# Patient Record
Sex: Female | Born: 1960 | Race: White | Hispanic: No | Marital: Married | Smoking: Never smoker
Health system: Southern US, Community
[De-identification: ages and names within clinical notes are randomized; demographics above are authoritative.]

## PROBLEM LIST (undated history)

## (undated) DIAGNOSIS — E119 Type 2 diabetes mellitus without complications: Secondary | ICD-10-CM

## (undated) DIAGNOSIS — F3189 Other bipolar disorder: Secondary | ICD-10-CM

## (undated) DIAGNOSIS — F419 Anxiety disorder, unspecified: Secondary | ICD-10-CM

## (undated) DIAGNOSIS — Z9289 Personal history of other medical treatment: Secondary | ICD-10-CM

## (undated) DIAGNOSIS — G4730 Sleep apnea, unspecified: Secondary | ICD-10-CM

## (undated) DIAGNOSIS — S31109A Unspecified open wound of abdominal wall, unspecified quadrant without penetration into peritoneal cavity, initial encounter: Secondary | ICD-10-CM

## (undated) DIAGNOSIS — I10 Essential (primary) hypertension: Secondary | ICD-10-CM

## (undated) DIAGNOSIS — Z8631 Personal history of diabetic foot ulcer: Secondary | ICD-10-CM

## (undated) DIAGNOSIS — K289 Gastrojejunal ulcer, unspecified as acute or chronic, without hemorrhage or perforation: Secondary | ICD-10-CM

## (undated) DIAGNOSIS — E785 Hyperlipidemia, unspecified: Secondary | ICD-10-CM

## (undated) DIAGNOSIS — F32A Depression, unspecified: Secondary | ICD-10-CM

## (undated) DIAGNOSIS — K819 Cholecystitis, unspecified: Secondary | ICD-10-CM

## (undated) DIAGNOSIS — T8579XA Infection and inflammatory reaction due to other internal prosthetic devices, implants and grafts, initial encounter: Secondary | ICD-10-CM

## (undated) DIAGNOSIS — R109 Unspecified abdominal pain: Secondary | ICD-10-CM

## (undated) DIAGNOSIS — G319 Degenerative disease of nervous system, unspecified: Secondary | ICD-10-CM

## (undated) HISTORY — DX: Personal history of other medical treatment: Z92.89

## (undated) HISTORY — DX: Unspecified abdominal pain: R10.9

## (undated) HISTORY — DX: Type 2 diabetes mellitus without complications: E11.9

## (undated) HISTORY — PX: BREAST BIOPSY: SHX20

## (undated) HISTORY — DX: Depression, unspecified: F32.A

## (undated) HISTORY — DX: Personal history of diabetic foot ulcer: Z86.31

## (undated) HISTORY — DX: Degenerative disease of nervous system, unspecified: G31.9

## (undated) HISTORY — DX: Gastrojejunal ulcer, unspecified as acute or chronic, without hemorrhage or perforation: K28.9

## (undated) HISTORY — DX: Essential (primary) hypertension: I10

## (undated) HISTORY — DX: Unspecified open wound of abdominal wall, unspecified quadrant without penetration into peritoneal cavity, initial encounter: S31.109A

## (undated) HISTORY — PX: SMALL INTESTINE SURGERY: SHX150

## (undated) HISTORY — PX: ABDOMINAL HYSTERECTOMY: SHX81

## (undated) HISTORY — DX: Sleep apnea, unspecified: G47.30

## (undated) HISTORY — DX: Cholecystitis, unspecified: K81.9

## (undated) HISTORY — DX: Hyperlipidemia, unspecified: E78.5

## (undated) HISTORY — PX: OTHER SURGICAL HISTORY: SHX169

## (undated) HISTORY — DX: Other bipolar disorder: F31.89

## (undated) HISTORY — DX: Anxiety disorder, unspecified: F41.9

---

## 1994-07-03 HISTORY — PX: OTHER SURGICAL HISTORY: SHX169

## 2019-07-04 DIAGNOSIS — Z8631 Personal history of diabetic foot ulcer: Secondary | ICD-10-CM | POA: Insufficient documentation

## 2020-04-01 DIAGNOSIS — E1165 Type 2 diabetes mellitus with hyperglycemia: Secondary | ICD-10-CM | POA: Insufficient documentation

## 2020-04-01 DIAGNOSIS — Z7689 Persons encountering health services in other specified circumstances: Secondary | ICD-10-CM | POA: Insufficient documentation

## 2020-04-01 DIAGNOSIS — R202 Paresthesia of skin: Secondary | ICD-10-CM | POA: Insufficient documentation

## 2020-04-01 DIAGNOSIS — IMO0002 Reserved for concepts with insufficient information to code with codable children: Secondary | ICD-10-CM | POA: Insufficient documentation

## 2020-04-01 DIAGNOSIS — F3189 Other bipolar disorder: Secondary | ICD-10-CM | POA: Insufficient documentation

## 2020-04-01 DIAGNOSIS — S31109A Unspecified open wound of abdominal wall, unspecified quadrant without penetration into peritoneal cavity, initial encounter: Secondary | ICD-10-CM | POA: Insufficient documentation

## 2020-04-01 DIAGNOSIS — I10 Essential (primary) hypertension: Secondary | ICD-10-CM | POA: Insufficient documentation

## 2020-04-23 ENCOUNTER — Other Ambulatory Visit: Payer: Self-pay | Admitting: General Surgery

## 2020-04-23 DIAGNOSIS — L02211 Cutaneous abscess of abdominal wall: Secondary | ICD-10-CM

## 2020-04-23 DIAGNOSIS — S31109A Unspecified open wound of abdominal wall, unspecified quadrant without penetration into peritoneal cavity, initial encounter: Secondary | ICD-10-CM

## 2020-05-05 ENCOUNTER — Ambulatory Visit
Admission: RE | Admit: 2020-05-05 | Discharge: 2020-05-05 | Disposition: A | Discharge status: Home / Self Care | Payer: Medicare Other | Source: Ambulatory Visit | Attending: General Surgery | Admitting: General Surgery

## 2020-05-05 ENCOUNTER — Other Ambulatory Visit: Payer: Self-pay

## 2020-05-05 DIAGNOSIS — L02211 Cutaneous abscess of abdominal wall: Secondary | ICD-10-CM

## 2020-05-05 DIAGNOSIS — S31109A Unspecified open wound of abdominal wall, unspecified quadrant without penetration into peritoneal cavity, initial encounter: Secondary | ICD-10-CM | POA: Insufficient documentation

## 2020-05-05 MED ORDER — IOHEXOL 300 MG/ML  SOLN
100.0000 mL | Freq: Once | INTRAMUSCULAR | Status: AC | PRN
  Administered 2020-05-05: 100 mL via INTRAVENOUS

## 2020-05-18 DIAGNOSIS — T8189XA Other complications of procedures, not elsewhere classified, initial encounter: Secondary | ICD-10-CM | POA: Insufficient documentation

## 2020-06-09 DIAGNOSIS — R5381 Other malaise: Secondary | ICD-10-CM | POA: Insufficient documentation

## 2020-09-07 ENCOUNTER — Inpatient Hospital Stay: Payer: Medicare Other

## 2020-09-07 ENCOUNTER — Inpatient Hospital Stay: Payer: Medicare Other | Attending: Oncology | Admitting: Oncology

## 2020-09-07 ENCOUNTER — Encounter: Payer: Self-pay | Admitting: Oncology

## 2020-09-07 ENCOUNTER — Encounter (INDEPENDENT_AMBULATORY_CARE_PROVIDER_SITE_OTHER): Payer: Self-pay

## 2020-09-07 VITALS — BP 117/81 | HR 60 | Temp 97.7°F | Resp 18 | Ht 66.0 in | Wt 284.3 lb

## 2020-09-07 DIAGNOSIS — Z1501 Genetic susceptibility to malignant neoplasm of breast: Secondary | ICD-10-CM | POA: Diagnosis not present

## 2020-09-07 DIAGNOSIS — Z809 Family history of malignant neoplasm, unspecified: Secondary | ICD-10-CM

## 2020-09-07 DIAGNOSIS — Z800 Family history of malignant neoplasm of digestive organs: Secondary | ICD-10-CM | POA: Insufficient documentation

## 2020-09-07 DIAGNOSIS — E119 Type 2 diabetes mellitus without complications: Secondary | ICD-10-CM | POA: Insufficient documentation

## 2020-09-07 DIAGNOSIS — Z833 Family history of diabetes mellitus: Secondary | ICD-10-CM | POA: Insufficient documentation

## 2020-09-07 DIAGNOSIS — Z8042 Family history of malignant neoplasm of prostate: Secondary | ICD-10-CM | POA: Insufficient documentation

## 2020-09-07 DIAGNOSIS — Z7984 Long term (current) use of oral hypoglycemic drugs: Secondary | ICD-10-CM | POA: Insufficient documentation

## 2020-09-07 DIAGNOSIS — Z801 Family history of malignant neoplasm of trachea, bronchus and lung: Secondary | ICD-10-CM | POA: Diagnosis not present

## 2020-09-07 DIAGNOSIS — Z8249 Family history of ischemic heart disease and other diseases of the circulatory system: Secondary | ICD-10-CM | POA: Insufficient documentation

## 2020-09-07 DIAGNOSIS — Z803 Family history of malignant neoplasm of breast: Secondary | ICD-10-CM | POA: Diagnosis not present

## 2020-09-07 DIAGNOSIS — I10 Essential (primary) hypertension: Secondary | ICD-10-CM | POA: Diagnosis not present

## 2020-09-07 DIAGNOSIS — Z8349 Family history of other endocrine, nutritional and metabolic diseases: Secondary | ICD-10-CM | POA: Diagnosis not present

## 2020-09-07 DIAGNOSIS — Z1509 Genetic susceptibility to other malignant neoplasm: Secondary | ICD-10-CM | POA: Diagnosis not present

## 2020-09-07 DIAGNOSIS — Z807 Family history of other malignant neoplasms of lymphoid, hematopoietic and related tissues: Secondary | ICD-10-CM | POA: Diagnosis not present

## 2020-09-07 DIAGNOSIS — Z90710 Acquired absence of both cervix and uterus: Secondary | ICD-10-CM | POA: Diagnosis not present

## 2020-09-07 DIAGNOSIS — Z79899 Other long term (current) drug therapy: Secondary | ICD-10-CM | POA: Diagnosis not present

## 2020-09-07 DIAGNOSIS — T8189XS Other complications of procedures, not elsewhere classified, sequela: Secondary | ICD-10-CM

## 2020-09-07 NOTE — Progress Notes (Signed)
Patient here to establish care  

## 2020-09-07 NOTE — Progress Notes (Addendum)
Hematology/Oncology Consult note [facility] Telephone:([phone] [phone]   Patient Care Team: Gladstone Lighter, MD as PCP - General (Internal Medicine)  REFERRING PROVIDER: Gladstone Lighter, MD  CHIEF COMPLAINTS/REASON FOR VISIT:  Evaluation of genetic susceptibility to cancer  HISTORY OF PRESENTING ILLNESS:   Sherry Joyce is a  60 y.o.  female with PMH listed below was seen in consultation at the request of  Gladstone Lighter, MD  for evaluation of  genetic susceptibility to cancer.  Patient reports significant family history of cancer, one sister with breast cancer, another sister with skin cancer, paternal grandfather with history of colon cancer, maternal uncle with colon cancer, another maternal uncle with lung cancer and prostate cancer and skin cancer, mother with Hodgkin's lymphoma.  Covid infection in January 2022. She has diabetes. She has a wound in her left foot which was debrided by podiatry.  Patient reports moving from [place] a few months ago. She was tested positive for BRCA2 mutation there. Underwent Patient had TAH BSO done had an nonhealing abdominal wound.  Patient reports that she has been seen by Dr. Peyton Najjar. There was plan for bilateral mastectomy prophylactically however plan was delayed due to Covid and nonhealing abdominal wound.  She follows up with wound care She reports that her last mammogram was done in about a year ago.    Review of Systems  Constitutional: Negative for appetite change, chills, fatigue and fever.  HENT:   Negative for hearing loss and voice change.   Eyes: Negative for eye problems.  Respiratory: Negative for chest tightness and cough.   Cardiovascular: Negative for chest pain.  Gastrointestinal: Negative for abdominal distention, abdominal pain and blood in stool.  Endocrine: Negative for hot flashes.  Genitourinary: Negative for difficulty urinating and frequency.   Musculoskeletal:  Negative for arthralgias.  Skin: Negative for itching and rash.       Nonhealing abdominal surgical wound  Neurological: Negative for extremity weakness.  Hematological: Negative for adenopathy.  Psychiatric/Behavioral: Negative for confusion.    MEDICAL HISTORY:  Past Medical History:  Diagnosis Date  . Abdominal pain of unknown etiology   . Anxiety   . 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 Cholecystitis   . Depression   . Diabetes mellitus without complication ([place])   . Gastrointestinal ulcer   . History of blood transfusion   . Hyperlipidemia   . Hypertension   . 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[place])   . Open abdominal wall wound   . Personal history of diabetic foot ulcer   . Sleep apnea     SURGICAL HISTORY: Past Surgical History:  Procedure Laterality Date  . ABDOMINAL HYSTERECTOMY    . cholecstectomy  1996  . hernia repair  1996   found during gallbladder surgery  . PR cesasean delivery     1984, [place]  . PR exploratory of abdomen  1996  . SMALL INTESTINE SURGERY      SOCIAL HISTORY: Social History   Socioeconomic History  . Marital status: Married    Spouse name: Not on file  . Number of children: Not on file  . Years of education: Not on file  . Highest education level: Not on file  Occupational History  . Not on file  Tobacco Use  . Smoking status: Never Smoker  . Smokeless tobacco: Never Used  Vaping Use  . Vaping Use: Never used  Substance and Sexual Activity  . Alcohol use: Never  . Drug use: Never  . Sexual  activity: Not on file  Other Topics Concern  . Not on file  Social History Narrative  . Not on file   Social Determinants of Health   Financial Resource Strain: Not on file  Food Insecurity: Not on file  Transportation Needs: Not on file  Physical Activity: Not on file  Stress: Not on file  Social Connections: Not on file  Intimate Partner Violence: Not on file    FAMILY HISTORY: Family History  Problem Relation  Age of Onset  . Hodgkin's lymphoma Mother   . Skin cancer Father   . Alzheimer's disease Father   . Asthma Father   . COPD Father   . Colon polyps Father   . Diabetes Father   . Hypertension Father   . Cancer Father        skin cancer  . Hyperlipidemia Father   . Obesity Father   . Sleep apnea Father   . Breast cancer Sister   . Alcohol abuse Maternal Uncle   . Hypertension Brother   . Sleep apnea Brother   . Diabetes Maternal Aunt   . Hypertension Maternal Grandmother   . Hyperlipidemia Maternal Grandmother   . Sudden Cardiac Death Maternal Grandmother   . Stroke Maternal Grandfather   . Sudden Cardiac Death Maternal Grandfather   . Alzheimer's disease Paternal Grandmother   . Colon cancer Paternal Grandfather   . Obesity Daughter   . Depression Daughter   . Depression Daughter   . Obesity Daughter   . Sleep apnea Daughter   . Diabetes Maternal Aunt   . Colon cancer Maternal Uncle   . Sudden Cardiac Death Maternal Uncle   . Obesity Maternal Uncle   . Alcohol abuse Maternal Uncle   . Lung cancer Maternal Uncle   . Prostate cancer Maternal Uncle   . Skin cancer Maternal Uncle   . Depression Sister   . Skin cancer Sister   . Alcohol abuse Son   . Depression Son   . Hypertension Son   . Obesity Son   . Sleep apnea Son     ALLERGIES:  has no allergies on file.  MEDICATIONS:  Current Outpatient Medications  Medication Sig Dispense Refill  . amLODipine (NORVASC) 10 MG tablet Take 10 mg by mouth daily.    . Cyanocobalamin (VITAMIN B-12 IJ) Inject as directed every 28 (twenty-eight) days.    Marland Kitchen FARXIGA 10 MG TABS tablet Take 10 mg by mouth daily.    . fluconazole (DIFLUCAN) 100 MG tablet Take 100 mg by mouth daily.    Marland Kitchen glimepiride (AMARYL) 4 MG tablet Take 4 mg by mouth daily.    Marland Kitchen ibuprofen (ADVIL) 800 MG tablet Take 800 mg by mouth 2 (two) times daily as needed.    . lamoTRIgine (LAMICTAL) 200 MG tablet Take 200 mg by mouth 2 (two) times daily.    Marland Kitchen losartan  (COZAAR) 50 MG tablet Take 50 mg by mouth daily.    . metFORMIN (GLUCOPHAGE-XR) 500 MG 24 hr tablet Take 1 tablet by mouth 2 (two) times daily with a meal.    . Oxcarbazepine (TRILEPTAL) 300 MG tablet Take 300 mg by mouth 2 (two) times daily.    . pravastatin (PRAVACHOL) 20 MG tablet Take 20 mg by mouth at bedtime.    . RYBELSUS 7 MG TABS Take 1 tablet by mouth daily.    . sertraline (ZOLOFT) 100 MG tablet Take 100 mg by mouth daily.     No current facility-administered medications for this  visit.     PHYSICAL EXAMINATION: ECOG PERFORMANCE STATUS: 1 - Symptomatic but completely ambulatory Vitals:   09/07/20 1147  BP: 117/81  Pulse: 60  Resp: 18  Temp: 97.7 F (36.5 C)   Filed Weights   09/07/20 1147  Weight: 284 lb 4.8 oz (129 kg)    Physical Exam Constitutional:      General: She is not in acute distress.    Appearance: She is obese.  HENT:     Head: Normocephalic and atraumatic.  Eyes:     General: No scleral icterus. Cardiovascular:     Rate and Rhythm: Normal rate and regular rhythm.     Heart sounds: Normal heart sounds.  Pulmonary:     Effort: Pulmonary effort is normal. No respiratory distress.     Breath sounds: No wheezing.  Abdominal:     General: Bowel sounds are normal. There is no distension.     Palpations: Abdomen is soft.  Musculoskeletal:        General: No deformity. Normal range of motion.     Cervical back: Normal range of motion and neck supple.  Skin:    General: Skin is warm.  Neurological:     Mental Status: She is alert and oriented to person, place, and time. Mental status is at baseline.     Cranial Nerves: No cranial nerve deficit.     Coordination: Coordination normal.  Psychiatric:        Mood and Affect: Mood normal.     LABORATORY DATA:  I have reviewed the data as listed No results found for: WBC, HGB, HCT, MCV, PLT No results for input(s): NA, K, CL, CO2, GLUCOSE, BUN, CREATININE, CALCIUM, GFRNONAA, GFRAA, PROT, ALBUMIN,  AST, ALT, ALKPHOS, BILITOT, BILIDIR, IBILI in the last 8760 hours. Iron/TIBC/Ferritin/ %Sat No results found for: IRON, TIBC, FERRITIN, IRONPCTSAT    RADIOGRAPHIC STUDIES: I have personally reviewed the radiological images as listed and agreed with the findings in the report. No results found.    ASSESSMENT & PLAN:  1. Family history of cancer   2. Non-healing surgical wound, sequela    #Family history of cancer, report to be BRCA2 positive. Will have patient sign release form and try to obtain medical records from [place]. She has considered prophylactic bilateral mastectomy however surgery is delayed due to Covid as well as surgical wound issue. I recommend patient to proceed with MRI breast bilateral annually alternating with diagnostic mammogram annually with images staggered by 6 months. Further recommendation pending her medical records and breast image results.   All questions were answered. The patient knows to call the clinic with any problems questions or concerns.  cc Gladstone Lighter, MD    Return of visit: Follow-up to be determined  Earlie Server, MD, PhD Hematology [place] at [facility] Pager- [phone] 09/07/2020  # Addendum:  11/11/2020 received copy of her genetic testing- scanned to EMR 09/12/2019 [facility] BRCA1 and 2 analysis showed positive for BRCA2 heterozygous mutation, [id number].   Earlie Server

## 2020-09-14 ENCOUNTER — Telehealth: Payer: Self-pay | Admitting: *Deleted

## 2020-09-14 NOTE — Telephone Encounter (Signed)
Genetic testing results have been requested but not received yet.

## 2020-09-14 NOTE — Telephone Encounter (Signed)
Call returned to patient , she said that she will call [place] and ask that they please fax results as requested

## 2020-09-14 NOTE — Telephone Encounter (Signed)
Does she want the sisters results?

## 2020-09-14 NOTE — Telephone Encounter (Signed)
If we are going to get her BRCA result then I don't need a copy of her sister's result. If we are not able to get the results, I will ask her to see genetic counselor who may want to have those results. Please ask her to keep results at home and if needed, we will ask her to bring in.

## 2020-09-14 NOTE — Telephone Encounter (Signed)
Patient called asking if Dr Tasia Catchings has received the Genetic Testing form her doctor in [place] yet. She reports that she has the genetic testing of her 2 sisters on her phone and is asking if Dr Tasia Catchings would want that information. Please Sherry Joyce

## 2020-09-24 NOTE — Telephone Encounter (Signed)
Received surg path records and last OV note from [facility]. Per fax cover sheet, they did  not have any genetic testing. Contacted pt and notified her and she states that she was aware and was working to get a copy of the BRCA testing as well. She has contacted [facility] and they told her that the ordering Dr would have to request it to get it faster. She has contacted that doctor and they are working to obtain results. They will fax a copy to [place] once obtained.

## 2020-10-28 ENCOUNTER — Telehealth: Payer: Self-pay | Admitting: Oncology

## 2020-10-28 NOTE — Telephone Encounter (Signed)
Spoke to patient and states that she cannot find BRCA results, she has tried to request them and has not received anything. We requested them from [facility] back in March and were told that they did not have any results. Pt states that Dr. Yu wanted to see results before scheduling follow up. Since she in unable to find results she is asking what the follow up plan is? Please advise.   

## 2020-10-28 NOTE — Telephone Encounter (Signed)
Please refer her to genetic counselor

## 2020-10-29 NOTE — Telephone Encounter (Signed)
Called patient back and she states that after we talked yesterday, she received call from [place] clinic stating that they had her BRCA results and will be mailing to her. She will bring them to cancer center once she receives it. Is referral to genetic counselor still needed?

## 2020-11-02 NOTE — Telephone Encounter (Signed)
We will wait for BRCA results for MD to review prior to scheduling f/u appt. Genetic counselor appt no longer needed.

## 2020-11-09 ENCOUNTER — Telehealth: Payer: Self-pay | Admitting: Oncology

## 2020-11-09 NOTE — Telephone Encounter (Signed)
Patient left vm requesting appointment with Dr. Yu as her BRCA results are back.  Please advise.   

## 2020-11-10 NOTE — Telephone Encounter (Signed)
Can we ask her to drop a copy of her result? And I plan to order images based on the images. Discussed during her last visit.

## 2020-11-10 NOTE — Telephone Encounter (Signed)
Patient called the office on 11/09/20 requestting appointment with Dr. Tasia Catchings as her BRCA results are back.  Patient has results but has not provided a copy to our office yet.  OK to schedule appt and MD review results at visit?

## 2020-11-10 NOTE — Telephone Encounter (Signed)
Dr. Tasia Catchings, patient has results but no copy give to [place] yet.  OK to schedule MD appt?

## 2020-11-11 NOTE — Telephone Encounter (Signed)
Copy given to MD and copy sent to be scanned in chart.

## 2020-11-11 NOTE — Telephone Encounter (Signed)
Patient plans to bring a copy of results today

## 2020-11-11 NOTE — Telephone Encounter (Signed)
See 10/28/20 phone note

## 2020-11-12 ENCOUNTER — Other Ambulatory Visit: Payer: Self-pay

## 2020-11-12 DIAGNOSIS — Z809 Family history of malignant neoplasm, unspecified: Secondary | ICD-10-CM

## 2020-11-12 DIAGNOSIS — Z1501 Genetic susceptibility to malignant neoplasm of breast: Secondary | ICD-10-CM

## 2020-11-12 NOTE — Progress Notes (Signed)
Error

## 2020-11-12 NOTE — Telephone Encounter (Signed)
Dr. Tasia Catchings, please let us know schedule plan after reviewing results.

## 2020-11-12 NOTE — Telephone Encounter (Signed)
Pt is going to [place] to sign consent to get images from [place]. Once they get those they will call pt for an appt to be set up. Pt is aware of this and understands. Thanks

## 2020-11-12 NOTE — Telephone Encounter (Signed)
Per Staff message from Dr. Tasia Catchings:   Reviewed her genetic testing. She is positive for BRCA2  Please arrange her to get MRI breast bilateral next available. . Reason is BRCA2 carrier. Thanks.  She can follow up with me about 6 months after MRI. MD only.   Colette please schedule appts as requested and call pt with appts, she is aware of plan. Pt's last mammograms were done in [place] and she is aware that she may need to go to [place] to sign release of info, if needed.

## 2020-11-23 ENCOUNTER — Telehealth: Payer: Self-pay | Admitting: *Deleted

## 2020-11-23 NOTE — Telephone Encounter (Signed)
Per Phone note from [place] on 5/13: Pt is going to [place] to sign consent to get images from [place]. Once they get those they will call pt for an appt to be set up.  She will need call [facility] to follow up on MRI appt. We will call her with follow up appt with Dr. Tasia Catchings once MRI is scheduled.

## 2020-11-23 NOTE — Telephone Encounter (Signed)
Colette, could you call [facility] for an update?

## 2020-11-23 NOTE — Telephone Encounter (Signed)
Patient called and states that she was told that she would be getting a call for MRI, but hse has not heard anything form anyone about appointment yet. I do not see that an appointment has been scheduled

## 2020-11-24 NOTE — Telephone Encounter (Signed)
Called pt has not been by to sign consent, called her and gave her directions again and she apologized again for not doing this sooner. She will take care of it this week.

## 2020-12-14 ENCOUNTER — Other Ambulatory Visit: Payer: Self-pay | Admitting: *Deleted

## 2020-12-14 ENCOUNTER — Inpatient Hospital Stay
Admission: RE | Admit: 2020-12-14 | Discharge: 2020-12-14 | Disposition: A | Discharge status: Home / Self Care | Payer: Self-pay | Source: Ambulatory Visit | Attending: *Deleted | Admitting: *Deleted

## 2020-12-14 DIAGNOSIS — Z9289 Personal history of other medical treatment: Secondary | ICD-10-CM

## 2020-12-16 NOTE — Telephone Encounter (Signed)
Per Aldona Bar @ [facility] :  Her last mammogram report we received was on 08/11/19, over a year ago.   So she is overdue for her yearly mammogram, which needs to be done prior to scheduling a breast MRI. The radiologist recommendation for a breast MRI for a high risk pt is for the breast MRI to be done 6 months after the mammogram.   Called patient and she confirmed that her last mammo was in Feb 2021. She will call [facility] to set up mammogram and MRI appt.

## 2020-12-22 ENCOUNTER — Other Ambulatory Visit: Payer: Self-pay | Admitting: Internal Medicine

## 2020-12-22 DIAGNOSIS — Z1231 Encounter for screening mammogram for malignant neoplasm of breast: Secondary | ICD-10-CM

## 2020-12-27 ENCOUNTER — Ambulatory Visit
Admission: RE | Admit: 2020-12-27 | Discharge: 2020-12-27 | Disposition: A | Discharge status: Home / Self Care | Payer: Medicare Other | Source: Ambulatory Visit | Attending: Internal Medicine | Admitting: Internal Medicine

## 2020-12-27 ENCOUNTER — Other Ambulatory Visit: Payer: Self-pay

## 2020-12-27 DIAGNOSIS — Z1231 Encounter for screening mammogram for malignant neoplasm of breast: Secondary | ICD-10-CM | POA: Insufficient documentation

## 2021-01-05 ENCOUNTER — Telehealth: Payer: Self-pay

## 2021-01-05 NOTE — Telephone Encounter (Signed)
-----   Message from Earlie Server, MD sent at 01/03/2021  1:09 PM EDT ----- Madaline Brilliant, please schedule her to do MRI 6 months after mammogram and I will see her a few days after MRI. Thanks.  ----- Message ----- From: Evelina Dun, RN Sent: 12/30/2020  10:09 AM EDT To: Evelina Dun, RN, Earlie Server, MD  Patient was unable to get MRI breast because she was overdue for mammogram. Mammogram was done on 6/28. When I spoke to [place] at [facility] a couple weeks ago she stated that MRI is usually done 6 months after Mammo. At last visit on 3/8 you said you wanted her to follow up with you 6 weeks after MRI. Any change of plans with follow up? are we ok to schedule MRI now that she has done mammo.

## 2021-01-05 NOTE — Telephone Encounter (Signed)
Pt had Mammo on 6/27, can you please schedule MRI & MD follow up as requested and notify pt of appt please. Thanks.

## 2021-01-06 NOTE — Telephone Encounter (Signed)
Can you ladies please schedule and notify pt of appt. 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 Thanks

## 2021-05-30 ENCOUNTER — Ambulatory Visit: Payer: Medicare Other

## 2021-06-01 ENCOUNTER — Inpatient Hospital Stay: Payer: Medicare Other | Admitting: Oncology

## 2021-06-14 ENCOUNTER — Ambulatory Visit
Admission: RE | Admit: 2021-06-14 | Discharge: 2021-06-14 | Disposition: A | Discharge status: Home / Self Care | Payer: Medicare Other | Source: Ambulatory Visit | Attending: Oncology | Admitting: Oncology

## 2021-06-14 ENCOUNTER — Other Ambulatory Visit: Payer: Self-pay | Admitting: Internal Medicine

## 2021-06-14 DIAGNOSIS — Z1509 Genetic susceptibility to other malignant neoplasm: Secondary | ICD-10-CM | POA: Diagnosis present

## 2021-06-14 DIAGNOSIS — Z1501 Genetic susceptibility to malignant neoplasm of breast: Secondary | ICD-10-CM | POA: Diagnosis present

## 2021-06-14 DIAGNOSIS — N6322 Unspecified lump in the left breast, upper inner quadrant: Secondary | ICD-10-CM | POA: Diagnosis not present

## 2021-06-14 DIAGNOSIS — L02216 Cutaneous abscess of umbilicus: Secondary | ICD-10-CM

## 2021-06-14 DIAGNOSIS — L089 Local infection of the skin and subcutaneous tissue, unspecified: Secondary | ICD-10-CM

## 2021-06-14 DIAGNOSIS — Z809 Family history of malignant neoplasm, unspecified: Secondary | ICD-10-CM | POA: Insufficient documentation

## 2021-06-14 MED ORDER — GADOBUTROL 1 MMOL/ML IV SOLN
10.0000 mL | Freq: Once | INTRAVENOUS | Status: AC | PRN
  Administered 2021-06-14: 10 mL via INTRAVENOUS

## 2021-06-16 ENCOUNTER — Other Ambulatory Visit: Payer: Self-pay | Admitting: Oncology

## 2021-06-16 DIAGNOSIS — R928 Other abnormal and inconclusive findings on diagnostic imaging of breast: Secondary | ICD-10-CM

## 2021-06-16 DIAGNOSIS — N6320 Unspecified lump in the left breast, unspecified quadrant: Secondary | ICD-10-CM

## 2021-06-17 ENCOUNTER — Inpatient Hospital Stay: Payer: Medicare Other | Admitting: Oncology

## 2021-06-23 ENCOUNTER — Inpatient Hospital Stay: Payer: Medicare Other | Attending: Oncology | Admitting: Oncology

## 2021-06-23 ENCOUNTER — Encounter: Payer: Self-pay | Admitting: Oncology

## 2021-06-23 DIAGNOSIS — Z7984 Long term (current) use of oral hypoglycemic drugs: Secondary | ICD-10-CM | POA: Insufficient documentation

## 2021-06-23 DIAGNOSIS — E1165 Type 2 diabetes mellitus with hyperglycemia: Secondary | ICD-10-CM | POA: Diagnosis not present

## 2021-06-23 DIAGNOSIS — R930 Abnormal findings on diagnostic imaging of skull and head, not elsewhere classified: Secondary | ICD-10-CM | POA: Diagnosis not present

## 2021-06-23 DIAGNOSIS — Z1509 Genetic susceptibility to other malignant neoplasm: Secondary | ICD-10-CM | POA: Diagnosis not present

## 2021-06-23 DIAGNOSIS — R928 Other abnormal and inconclusive findings on diagnostic imaging of breast: Secondary | ICD-10-CM

## 2021-06-23 DIAGNOSIS — Z1501 Genetic susceptibility to malignant neoplasm of breast: Secondary | ICD-10-CM | POA: Diagnosis not present

## 2021-06-23 DIAGNOSIS — T8189XS Other complications of procedures, not elsewhere classified, sequela: Secondary | ICD-10-CM | POA: Diagnosis not present

## 2021-06-23 NOTE — Progress Notes (Signed)
Pt contacted for Mychart visit. Pt reports she is currently on Augmentin and prednisone for bronchitis.
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
## 2021-06-28 ENCOUNTER — Ambulatory Visit
Admission: RE | Admit: 2021-06-28 | Discharge: 2021-06-28 | Disposition: A | Discharge status: Home / Self Care | Payer: Medicare Other | Source: Ambulatory Visit | Attending: Oncology | Admitting: Oncology

## 2021-06-28 ENCOUNTER — Other Ambulatory Visit: Payer: Self-pay

## 2021-06-28 DIAGNOSIS — N6320 Unspecified lump in the left breast, unspecified quadrant: Secondary | ICD-10-CM | POA: Diagnosis present

## 2021-06-28 DIAGNOSIS — R928 Other abnormal and inconclusive findings on diagnostic imaging of breast: Secondary | ICD-10-CM | POA: Diagnosis present

## 2021-07-05 ENCOUNTER — Telehealth: Payer: Self-pay

## 2021-07-05 NOTE — Telephone Encounter (Signed)
-----   Message from Earlie Server, MD sent at 07/05/2021  8:21 AM EST ----- She needs MRI breast biopsy of the 2 left breast lesions.

## 2021-07-05 NOTE — Telephone Encounter (Signed)
Contacted Johnston Ebbs at [facility] regarding getting pt scheduled and what order to put in. Left message for her to call back and let us know how to proceed.

## 2021-07-06 ENCOUNTER — Other Ambulatory Visit: Payer: Self-pay

## 2021-07-06 DIAGNOSIS — Z1509 Genetic susceptibility to other malignant neoplasm: Secondary | ICD-10-CM

## 2021-07-06 DIAGNOSIS — R928 Other abnormal and inconclusive findings on diagnostic imaging of breast: Secondary | ICD-10-CM

## 2021-07-06 DIAGNOSIS — Z1501 Genetic susceptibility to malignant neoplasm of breast: Secondary | ICD-10-CM

## 2021-07-06 NOTE — Telephone Encounter (Signed)
Called [facility] and spoke to Dieterich who provided correct order for procedure. Orders entered. She will contact pt to schedule biopsy.

## 2021-07-08 ENCOUNTER — Other Ambulatory Visit: Payer: Self-pay | Admitting: Oncology

## 2021-07-08 DIAGNOSIS — R928 Other abnormal and inconclusive findings on diagnostic imaging of breast: Secondary | ICD-10-CM

## 2021-07-08 NOTE — Telephone Encounter (Addendum)
Called pt and she says that [facility] is out of network for her insurance and wants this done at a [facility], because she has financial assistance through them.  This type of biopsy is done through [facility]. I have reached out to GI and they are within pt's insurance network and they will reach out to pt to schedule this procedure.   Unable to reach pt, but left detailed VM to let her know about this information and that they will be reaching out to her to schedule biopsy.

## 2021-07-12 ENCOUNTER — Ambulatory Visit: Payer: Medicare Other

## 2021-07-12 NOTE — Telephone Encounter (Signed)
Spoke to patient and she would like this done at a [facility] facility since she she has financial assistance through them. I called [facility] scheduling and spoke to [facility] and she states we need to fax the order and then have pt call them to schedule procedure.   Called pt and told her about process and she verbalized understanding. She will call back to let us know procedure date/time. Orders have been faxed.   [facility] ph: [phone] fax: [phone]

## 2021-07-18 NOTE — Telephone Encounter (Addendum)
Received fax from [facility] asking for a call to schedule MR breast biopsy. Called to set up appt, but they are not working today in observance of holiday. Will call again tomorrow to set up appt  [facility] department:  [phone]

## 2021-07-19 NOTE — Telephone Encounter (Signed)
Left VM with Benjamine Mola @ [facility] for a call back to schedule patient's biopsy   Ph: [phone]

## 2021-07-20 NOTE — Telephone Encounter (Signed)
Spoke to [place], who states that they just received the images from [facility]. Once they were transferred to [facility] then she would submit care to MD for review. She would call us back with appt details.   Pt notified of this.

## 2021-07-22 ENCOUNTER — Telehealth: Payer: Self-pay | Admitting: Oncology

## 2021-07-22 ENCOUNTER — Telehealth: Payer: Self-pay | Admitting: *Deleted

## 2021-07-22 NOTE — Telephone Encounter (Signed)
Received call from [place] at [facility], stating that the Doctors have reviewed the case and are recommending a mammogram and anohter ultrasound before they can do the biospy. Called patietn to inform her of this. Provided her with [facility]'s number ot set these appts up [phone]. Asked her to call back with appt details once her biopsy is scheduled.

## 2021-07-22 NOTE — Telephone Encounter (Signed)
Pt called to let you know that she will be at [facility] on 1-24 @10am . Doing both ultrasound and mamo. Call back at [phone]

## 2021-07-22 NOTE — Telephone Encounter (Signed)
Patient called to inform us that her mammogram and [place] is schedule to be done at [facility] on 07/26/21 @ 10 AM

## 2021-07-25 NOTE — Telephone Encounter (Signed)
Pt scheduled for mammo and [place] on 1/24

## 2021-07-27 NOTE — Telephone Encounter (Signed)
Per [facility] mammo report, pt scheduled for MRI biopsy on 07/29/21 @ 11a and US biopsy @ 1:30p

## 2021-08-01 ENCOUNTER — Telehealth: Payer: Self-pay | Admitting: Oncology

## 2021-08-01 ENCOUNTER — Telehealth: Payer: Self-pay | Admitting: *Deleted

## 2021-08-01 NOTE — Telephone Encounter (Signed)
Pt called to make appt for her scan results. Call back at [phone]

## 2021-08-01 NOTE — Telephone Encounter (Signed)
Patient called stating that she had her biopsy at [facility] Friday and is asking when we will schedule a follow up appointment with Dr Tasia Catchings for her. Please advise

## 2021-08-01 NOTE — Telephone Encounter (Signed)
Per careEverywhere, path results are not ready yet, usually takes 7-10 days for it to be resulted.   Dr. Tasia Catchings when would you like for pt to follow up for results? She had biopsy on 1/27.

## 2021-08-02 ENCOUNTER — Telehealth: Payer: Self-pay | Admitting: Oncology

## 2021-08-02 NOTE — Telephone Encounter (Signed)
Contacted PCP's office and they will be faxing reports over.

## 2021-08-02 NOTE — Telephone Encounter (Signed)
I called pt to get her scheduled for her biopsy results. She stated that the results should have been faxed to her PCP Dr. Tressia Miners and was recommended an MRI in 6 months and mammo is 12 months. She stated she wanted to wait to schedule an appt until you read over there results and find it necessary for her to come in .

## 2021-08-02 NOTE — Telephone Encounter (Signed)
Biopsy report has not resulted yet. Please schedule patient for MD only early next week for results. Please inform pt of appt.

## 2021-08-03 NOTE — Telephone Encounter (Signed)
Dr. Tasia Catchings has reviewed biopsy report and results showed negative for cancer. She recommends MRI breast in 6 months and follow up with her a few days after scan. Spoke to patient and informed her of this and she voiced understanding. She would like the MRI breast done at [facility] facility due to insurance purposes. Informed her that if she needs scans done at [facility] due to insurance process then it would probably be best that she be referred to [facility] oncology/ high risk breast team to facilitate the process of ordering and getting results in a timely manner. Informed her that she will need to ask PCP to refer her to [facility] oncology / high risk breast team for further surveillance, pt verbalized understanding. Also informed her that if she decided that she wants to continue scans/ surveillance with Dr. Tasia Catchings, she can call back to set up app.

## 2021-08-04 ENCOUNTER — Encounter: Payer: Self-pay | Admitting: Internal Medicine

## 2022-02-12 ENCOUNTER — Encounter: Payer: Self-pay | Admitting: Gastroenterology

## 2022-02-12 NOTE — H&P (Signed)
Pre-Procedure H&P   Patient ID: Sherry Joyce is a 61 y.o. female.  Gastroenterology Provider: Annamaria Helling, DO  Referring Provider: Laurine Blazer, PA PCP: Gladstone Lighter, MD  Date: 02/13/2022  HPI Ms. Sherry Joyce is a 61 y.o. female who presents today for Esophagogastroduodenoscopy and Colonoscopy for Epigastric pain; surveillance- personal history of colon polyps.  Patient has noted epigastric fullness and discomfort especially postprandially.  She has noted increased bloating and diminished appetite which she attributes to her Trulicity. This is getting better. When seen in the office, she had noted some issues with swallowing, but this has resolved.  Has 1-2 bowel movements per day without melena or hematochezia.  She underwent colonoscopy in [place] in 2018 where a 3-year repeat was recommended.  She had a complicated oophorectomy and hysterectomy requiring ex lap Due to drainage. has also undergone a cholecystectomy.  Patient on chronic antibiotics.  Patient took her Trulicity on Friday and was instructed to have liquids over the last couple days.  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[place])    Cholecystitis    Depression    Diabetes mellitus without complication ([place])    Gastrointestinal ulcer    History of blood transfusion    Hyperlipidemia    Hypertension    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)    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[place])    Open abdominal wall wound    Personal history of diabetic foot ulcer    Sleep apnea     Past Surgical History:   Procedure Laterality Date   ABDOMINAL HYSTERECTOMY     BREAST BIOPSY Right    benign bx 2008  per pt   cholecstectomy  1996   hernia repair  1996   found during gallbladder surgery   PR cesasean delivery     1984, 1985, 1988   PR exploratory of abdomen  1996   SMALL INTESTINE SURGERY      Family History Maternal uncle and paternal grandfather with colorectal cancer, son with gastroparesis, father with colon polyps No h/o GI disease or malignancy  Review of Systems  Constitutional:  Positive for appetite change. Negative for activity change, chills, diaphoresis, fatigue, fever and unexpected weight change.  HENT:  Positive for trouble swallowing. Negative for voice change.   Respiratory:  Negative for shortness of breath and wheezing.   Cardiovascular:  Negative for chest pain, palpitations and leg swelling.  Gastrointestinal:  Positive for abdominal pain (epigastric). Negative for abdominal distention, anal bleeding, blood in stool, constipation, diarrhea, nausea, rectal pain and vomiting.       + bloating  Musculoskeletal:  Negative for arthralgias and myalgias.  Skin:  Negative for color change and pallor.  Neurological:  Negative for dizziness, syncope and weakness.  Psychiatric/Behavioral:  Negative for confusion.   All other systems reviewed and are negative.    Medications No current facility-administered medications on file prior to encounter.   Current Outpatient Medications on File Prior to Encounter  Medication Sig Dispense Refill   amoxicillin-clavulanate (AUGMENTIN) 875-125 MG tablet Take 1 tablet by mouth 2 (two) times daily.     doxycycline (ADOXA) 100  MG tablet Take 100 mg by mouth 2 (two) times daily.     Dulaglutide (TRULICITY) 1.5 TZ/0.0FV SOPN Inject 1.5 mg into the skin once a week.     Cyanocobalamin (VITAMIN B-12 IJ) Inject as directed every 28 (twenty-eight) days.     FARXIGA 10 MG TABS tablet Take 10 mg by mouth daily.     fluconazole (DIFLUCAN) 100  MG tablet Take 100 mg by mouth daily.     glimepiride (AMARYL) 4 MG tablet Take 4 mg by mouth daily.     ibuprofen (ADVIL) 800 MG tablet Take 800 mg by mouth 2 (two) times daily as needed.     losartan (COZAAR) 50 MG tablet Take 50 mg by mouth daily.     metFORMIN (GLUCOPHAGE-XR) 500 MG 24 hr tablet Take 1 tablet by mouth 2 (two) times daily with a meal.     pravastatin (PRAVACHOL) 20 MG tablet Take 20 mg by mouth at bedtime.     predniSONE (DELTASONE) 20 MG tablet Take 2 tablets by mouth daily for 5 days followed by 1 tablets by mouth daily for 5 days and stop      Pertinent medications related to GI and procedure were reviewed by me with the patient prior to the procedure   Current Facility-Administered Medications:    0.9 %  sodium chloride infusion, , Intravenous, Continuous, Annamaria Helling, DO, Last Rate: 20 mL/hr at 02/13/22 1010, New Bag at 02/13/22 1010      Not on File Allergies were reviewed by me prior to the procedure  Objective   Body mass index is 41.97 kg/m. Vitals:   02/13/22 0950  BP: 135/86  Pulse: 80  Resp: 20  Temp: (!) 96.9 F (36.1 C)  TempSrc: Temporal  SpO2: 98%  Weight: 117.9 kg  Height: '5\' 6"'$  (1.676 m)     Physical Exam Vitals and nursing note reviewed.  Constitutional:      General: She is not in acute distress.    Appearance: Normal appearance. She is obese. She is not ill-appearing, toxic-appearing or diaphoretic.  HENT:     Head: Normocephalic and atraumatic.     Nose: Nose normal.     Mouth/Throat:     Mouth: Mucous membranes are moist.     Pharynx: Oropharynx is clear.  Eyes:     General: No scleral icterus.    Extraocular Movements: Extraocular movements intact.  Cardiovascular:     Rate and Rhythm: Normal rate and regular rhythm.     Heart sounds: Normal heart sounds. No murmur heard.    No friction rub. No gallop.  Pulmonary:     Effort: Pulmonary effort is normal. No respiratory distress.     Breath sounds: Normal  breath sounds. No wheezing, rhonchi or rales.  Abdominal:     General: Bowel sounds are normal. There is no distension.     Palpations: Abdomen is soft.     Tenderness: There is no abdominal tenderness. There is no guarding or rebound.  Musculoskeletal:     Cervical back: Neck supple.     Right lower leg: No edema.     Left lower leg: No edema.  Skin:    General: Skin is warm and dry.     Coloration: Skin is not jaundiced or pale.  Neurological:     General: No focal deficit present.     Mental Status: She is alert and oriented to person, place, and time. Mental status is at baseline.  Psychiatric:  Mood and Affect: Mood normal.        Behavior: Behavior normal.        Thought Content: Thought content normal.        Judgment: Judgment normal.      Assessment:  Ms. Sherry Joyce is a 61 y.o. female  who presents today for Esophagogastroduodenoscopy and Colonoscopy for Epigastric pain; surveillance- personal history of colon polyps..  Plan:  Esophagogastroduodenoscopy and Colonoscopy with possible intervention today  Esophagogastroduodenoscopy and Colonoscopy with possible biopsy, control of bleeding, polypectomy, and interventions as necessary has been discussed with the patient/patient representative. Informed consent was obtained from the patient/patient representative after explaining the indication, nature, and risks of the procedure including but not limited to death, bleeding, perforation, missed neoplasm/lesions, cardiorespiratory compromise, and reaction to medications. Opportunity for questions was given and appropriate answers were provided. Patient/patient representative has verbalized understanding is amenable to undergoing the procedure.   Annamaria Helling, DO  [facility]  Portions of the record may have been created with voice recognition software. Occasional wrong-word or 'sound-a-like' substitutions may have occurred due to the  inherent limitations of voice recognition software.  Read the chart carefully and recognize, using context, where substitutions may have occurred.

## 2022-02-13 ENCOUNTER — Other Ambulatory Visit: Payer: Self-pay

## 2022-02-13 ENCOUNTER — Ambulatory Visit: Payer: Medicare Other | Admitting: Anesthesiology

## 2022-02-13 ENCOUNTER — Ambulatory Visit
Admission: RE | Admit: 2022-02-13 | Discharge: 2022-02-13 | Disposition: A | Discharge status: Home / Self Care | Payer: Medicare Other | Source: Ambulatory Visit | Attending: Gastroenterology | Admitting: Gastroenterology

## 2022-02-13 ENCOUNTER — Encounter
Admission: RE | Disposition: A | Discharge status: Home / Self Care | Payer: Self-pay | Source: Ambulatory Visit | Attending: Gastroenterology

## 2022-02-13 ENCOUNTER — Encounter: Payer: Self-pay | Admitting: Gastroenterology

## 2022-02-13 DIAGNOSIS — F418 Other specified anxiety disorders: Secondary | ICD-10-CM | POA: Diagnosis not present

## 2022-02-13 DIAGNOSIS — K2950 Unspecified chronic gastritis without bleeding: Secondary | ICD-10-CM | POA: Insufficient documentation

## 2022-02-13 DIAGNOSIS — E785 Hyperlipidemia, unspecified: Secondary | ICD-10-CM | POA: Insufficient documentation

## 2022-02-13 DIAGNOSIS — K640 First degree hemorrhoids: Secondary | ICD-10-CM | POA: Diagnosis not present

## 2022-02-13 DIAGNOSIS — I10 Essential (primary) hypertension: Secondary | ICD-10-CM | POA: Diagnosis not present

## 2022-02-13 DIAGNOSIS — D123 Benign neoplasm of transverse colon: Secondary | ICD-10-CM | POA: Diagnosis not present

## 2022-02-13 DIAGNOSIS — Z7984 Long term (current) use of oral hypoglycemic drugs: Secondary | ICD-10-CM | POA: Diagnosis not present

## 2022-02-13 DIAGNOSIS — Z6841 Body Mass Index (BMI) 40.0 and over, adult: Secondary | ICD-10-CM | POA: Diagnosis not present

## 2022-02-13 DIAGNOSIS — K5730 Diverticulosis of large intestine without perforation or abscess without bleeding: Secondary | ICD-10-CM | POA: Insufficient documentation

## 2022-02-13 DIAGNOSIS — Z86010 Personal history of colonic polyps: Secondary | ICD-10-CM | POA: Insufficient documentation

## 2022-02-13 DIAGNOSIS — F319 Bipolar disorder, unspecified: Secondary | ICD-10-CM | POA: Insufficient documentation

## 2022-02-13 DIAGNOSIS — E119 Type 2 diabetes mellitus without complications: Secondary | ICD-10-CM | POA: Diagnosis not present

## 2022-02-13 DIAGNOSIS — Z1211 Encounter for screening for malignant neoplasm of colon: Secondary | ICD-10-CM | POA: Insufficient documentation

## 2022-02-13 DIAGNOSIS — G4730 Sleep apnea, unspecified: Secondary | ICD-10-CM | POA: Insufficient documentation

## 2022-02-13 DIAGNOSIS — Z79899 Other long term (current) drug therapy: Secondary | ICD-10-CM | POA: Insufficient documentation

## 2022-02-13 HISTORY — DX: Infection and inflammatory reaction due to other internal prosthetic devices, implants and grafts, initial encounter: T85.79XA

## 2022-02-13 HISTORY — PX: ESOPHAGOGASTRODUODENOSCOPY: SHX5428

## 2022-02-13 HISTORY — PX: COLONOSCOPY: SHX5424

## 2022-02-13 LAB — GLUCOSE, CAPILLARY: Glucose-Capillary: 124 mg/dL — ABNORMAL HIGH (ref 70–99)

## 2022-02-13 SURGERY — COLONOSCOPY
Anesthesia: General

## 2022-02-13 MED ORDER — SODIUM CHLORIDE 0.9 % IV SOLN: INTRAVENOUS | Status: DC

## 2022-02-13 MED ORDER — PROPOFOL 1000 MG/100ML IV EMUL
INTRAVENOUS | Status: AC
  Filled 2022-02-13: qty 100

## 2022-02-13 MED ORDER — PROPOFOL 500 MG/50ML IV EMUL
INTRAVENOUS | Status: DC | PRN
  Administered 2022-02-13: 150 ug/kg/min via INTRAVENOUS

## 2022-02-13 MED ORDER — LIDOCAINE HCL (PF) 2 % IJ SOLN
INTRAMUSCULAR | Status: AC
  Filled 2022-02-13: qty 5

## 2022-02-13 MED ORDER — LIDOCAINE HCL (CARDIAC) PF 100 MG/5ML IV SOSY
PREFILLED_SYRINGE | INTRAVENOUS | Status: DC | PRN
  Administered 2022-02-13: 100 mg via INTRAVENOUS

## 2022-02-13 NOTE — Anesthesia Postprocedure Evaluation (Signed)
Anesthesia Post Note  Patient: Suhayla Chisom  Procedure(s) Performed: COLONOSCOPY ESOPHAGOGASTRODUODENOSCOPY (EGD)  Patient location during evaluation: PACU Anesthesia Type: General Level of consciousness: awake Pain management: pain level controlled Vital Signs Assessment: post-procedure vital signs reviewed and stable Respiratory status: spontaneous breathing Cardiovascular status: stable Anesthetic complications: no   No notable events documented.   Last Vitals:  Vitals:   02/13/22 1145 02/13/22 1157  BP: 114/72 122/75  Pulse: 75 68  Resp: (!) 23 12  Temp:  (!) 36.1 C  SpO2: 100% 100%    Last Pain:  Vitals:   02/13/22 1157  TempSrc:   PainSc: 0-No pain                 VAN STAVEREN,Glennie Rodda

## 2022-02-13 NOTE — Interval H&P Note (Signed)
History and Physical Interval Note: Preprocedure H&P from 02/13/22  was reviewed and there was no interval change after seeing and examining the patient.  Written consent was obtained from the patient after discussion of risks, benefits, and alternatives. Patient has consented to proceed with Esophagogastroduodenoscopy and Colonoscopy with possible intervention   02/13/2022 10:41 AM  Sherry Joyce  has presented today for surgery, with the diagnosis of Personal history of colonic polyps (Z86.010) GERD Epigastric Pain.  The various methods of treatment have been discussed with the patient and family. After consideration of risks, benefits and other options for treatment, the patient has consented to  Procedure(s) with comments: COLONOSCOPY (N/A) - DM ESOPHAGOGASTRODUODENOSCOPY (EGD) (N/A) as a surgical intervention.  The patient's history has been reviewed, patient examined, no change in status, stable for surgery.  I have reviewed the patient's chart and labs.  Questions were answered to the patient's satisfaction.     Annamaria Helling

## 2022-02-13 NOTE — Op Note (Signed)
[facility] Gastroenterology Patient Name: Sherry Joyce Procedure Date: 02/13/2022 10:31 AM MRN: [id number] Account #: [id number] Date of Birth: 02-12-1961 Admit Type: Outpatient Age: 61 Room: [facility] ENDO ROOM 2 Gender: Female Note Status: Finalized Instrument Name: Colonoscope [id number] Procedure:             Colonoscopy Indications:           High risk colon cancer surveillance: Personal history                         of colonic polyps Providers:             Annamaria Helling DO, DO Medicines:             Monitored Anesthesia Care Complications:         No immediate complications. Estimated blood loss:                         Minimal. Procedure:             Pre-Anesthesia Assessment:                        - Prior to the procedure, a History and Physical was                         performed, and patient medications and allergies were                         reviewed. The patient is competent. The risks and                         benefits of the procedure and the sedation options and                         risks were discussed with the patient. All questions                         were answered and informed consent was obtained.                         Patient identification and proposed procedure were                         verified by the physician, the nurse, the anesthetist                         and the technician in the endoscopy suite. Mental                         Status Examination: alert and oriented. Airway                         Examination: normal oropharyngeal airway and neck                         mobility. Respiratory Examination: clear to                         auscultation. CV Examination: RRR, no murmurs, no S3  or S4. Prophylactic Antibiotics: The patient does not                         require prophylactic antibiotics. Prior                         Anticoagulants: The patient has taken no previous                          anticoagulant or antiplatelet agents. ASA Grade                         Assessment: III - A patient with severe systemic                         disease. After reviewing the risks and benefits, the                         patient was deemed in satisfactory condition to                         undergo the procedure. The anesthesia plan was to use                         monitored anesthesia care (MAC). Immediately prior to                         administration of medications, the patient was                         re-assessed for adequacy to receive sedatives. The                         heart rate, respiratory rate, oxygen saturations,                         blood pressure, adequacy of pulmonary ventilation, and                         response to care were monitored throughout the                         procedure. The physical status of the patient was                         re-assessed after the procedure.                        After obtaining informed consent, the colonoscope was                         passed under direct vision. Throughout the procedure,                         the patient's blood pressure, pulse, and oxygen                         saturations were monitored continuously. The  Colonoscope was introduced through the anus and                         advanced to the the cecum, identified by appendiceal                         orifice and ileocecal valve. The colonoscopy was                         performed without difficulty. The patient tolerated                         the procedure well. The quality of the bowel                         preparation was evaluated using the BBPS [facility] Bowel                         Preparation Scale) with scores of: Right Colon = 2                         (minor amount of residual staining, small fragments of                         stool and/or opaque liquid, but mucosa seen well),                          Transverse Colon = 3 (entire mucosa seen well with no                         residual staining, small fragments of stool or opaque                         liquid) and Left Colon = 3 (entire mucosa seen well                         with no residual staining, small fragments of stool or                         opaque liquid). The total BBPS score equals 8. The                         quality of the bowel preparation was excellent. The                         ileocecal valve, appendiceal orifice, and rectum were                         photographed. Findings:      The perianal and digital rectal examinations were normal. Pertinent       negatives include normal sphincter tone.      A 4 to 5 mm polyp was found in the transverse colon. The polyp was       sessile. The polyp was removed with a cold snare. Resection and       retrieval were complete. Estimated blood loss was minimal.      A 1  to 2 mm polyp was found in the descending colon. The polyp was       sessile. The polyp was removed with a cold biopsy forceps. Resection and       retrieval were complete. Estimated blood loss was minimal.      Multiple small-mouthed diverticula were found in the entire colon. More       prevalent in left colon. Estimated blood loss was minimal.      Non-bleeding internal hemorrhoids were found during retroflexion. The       hemorrhoids were Grade I (internal hemorrhoids that do not prolapse).       Estimated blood loss: none.      The exam was otherwise without abnormality on direct and retroflexion       views. Impression:            - One 4 to 5 mm polyp in the transverse colon, removed                         with a cold snare. Resected and retrieved.                        - One 1 to 2 mm polyp in the descending colon, removed                         with a cold biopsy forceps. Resected and retrieved.                        - Diverticulosis in the entire examined colon.                         - Non-bleeding internal hemorrhoids.                        - The examination was otherwise normal on direct and                         retroflexion views. Recommendation:        - Patient has a contact number available for                         emergencies. The signs and symptoms of potential                         delayed complications were discussed with the patient.                         Return to normal activities tomorrow. Written                         discharge instructions were provided to the patient.                        - Discharge patient to home.                        - Resume previous diet.                        - Continue present medications.                        -  No aspirin, ibuprofen, naproxen, or other                         non-steroidal anti-inflammatory drugs for 5 days after                         polyp removal.                        - Await pathology results.                        - Repeat colonoscopy for surveillance based on                         pathology results.                        - Return to GI office as previously scheduled.                        - The findings and recommendations were discussed with                         the patient. Procedure Code(s):     --- Professional ---                        [phone], Colonoscopy, flexible; with removal of                         tumor(s), polyp(s), or other lesion(s) by snare                         technique                        45380, 66, Colonoscopy, flexible; with biopsy, single                         or multiple Diagnosis Code(s):     --- Professional ---                        Z86.010, Personal history of colonic polyps                        K63.5, Polyp of colon                        K64.0, First degree hemorrhoids                        K57.30, Diverticulosis of large intestine without                         perforation or abscess without bleeding CPT copyright 2019  American Medical Association. All rights reserved. The codes documented in this report are preliminary and upon coder review may  be revised to meet current compliance requirements. Attending Participation:      I personally performed the entire procedure. Volney American, DO Annamaria Helling DO, DO 02/13/2022 11:39:13 AM This report has been signed electronically. Number of Addenda: 0 Note  Initiated On: 02/13/2022 10:31 AM Scope Withdrawal Time: 0 hours 20 minutes 10 seconds  Total Procedure Duration: 0 hours 29 minutes 2 seconds  Estimated Blood Loss:  Estimated blood loss was minimal.      [facility]

## 2022-02-13 NOTE — Anesthesia Preprocedure Evaluation (Signed)
Anesthesia Evaluation  Patient identified by MRN, date of birth, ID band Patient awake    Reviewed: Allergy & Precautions, NPO status , Patient's Chart, lab work & pertinent test results  Airway Mallampati: II  TM Distance: >3 FB Neck ROM: full    Dental  (+) Teeth Intact   Pulmonary neg pulmonary ROS,    Pulmonary exam normal  + decreased breath sounds      Cardiovascular Exercise Tolerance: Good hypertension, Pt. on medications negative cardio ROS Normal cardiovascular exam Rhythm:Regular     Neuro/Psych Anxiety Depression negative neurological ROS  negative psych ROS   GI/Hepatic negative GI ROS, Neg liver ROS, PUD,   Endo/Other  negative endocrine ROSdiabetes, Type 2, Oral Hypoglycemic AgentsMorbid obesity  Renal/GU negative Renal ROS  negative genitourinary   Musculoskeletal negative musculoskeletal ROS (+)   Abdominal (+) + obese,   Peds negative pediatric ROS (+)  Hematology negative hematology ROS (+)   Anesthesia Other Findings Past Medical History: No date: Abdominal pain of unknown etiology No date: Anxiety No date: Atypical bipolar affective disorder (HCC) No date: Cholecystitis No date: Depression No date: Diabetes mellitus without complication (HCC) No date: Gastrointestinal ulcer No date: History of blood transfusion No date: Hyperlipidemia No date: Hypertension No date: 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) No date: 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HCC) No date: Open abdominal wall wound No date: Personal history of diabetic foot ulcer No date: Sleep apnea  Past Surgical History: No date: ABDOMINAL HYSTERECTOMY No date: BREAST BIOPSY; Right     Comment:  benign bx 2008  per pt 1996: cholecstectomy 1996: hernia repair     Comment:  found during gallbladder surgery No date: PR cesasean delivery     Comment:  1984, 1985, 1988 1996: PR exploratory of abdomen No date: SMALL  INTESTINE SURGERY  BMI    Body Mass Index: 41.97 kg/m      Reproductive/Obstetrics negative OB ROS                             Anesthesia Physical Anesthesia Plan  ASA: 3  Anesthesia Plan: General   Post-op Pain Management:    Induction: Intravenous  PONV Risk Score and Plan: Propofol infusion and TIVA  Airway Management Planned: Natural Airway and Oral ETT  Additional Equipment:   Intra-op Plan:   Post-operative Plan:   Informed Consent: I have reviewed the patients History and Physical, chart, labs and discussed the procedure including the risks, benefits and alternatives for the proposed anesthesia with the patient or authorized representative who has indicated his/her understanding and acceptance.     Dental Advisory Given  Plan Discussed with: CRNA and Surgeon  Anesthesia Plan Comments:         Anesthesia Quick Evaluation

## 2022-02-13 NOTE — Transfer of Care (Signed)
Immediate Anesthesia Transfer of Care Note  Patient: Sherry Joyce  Procedure(s) Performed: COLONOSCOPY ESOPHAGOGASTRODUODENOSCOPY (EGD)  Patient Location: PACU  Anesthesia Type:General  Level of Consciousness: awake and sedated  Airway & Oxygen Therapy: Patient Spontanous Breathing and Patient connected to face mask oxygen  Post-op Assessment: Report given to RN and Post -op Vital signs reviewed and stable  Post vital signs: Reviewed and stable  Last Vitals:  Vitals Value Taken Time  BP 122/75 02/13/22 1157  Temp 36.1 C 02/13/22 1157  Pulse 66 02/13/22 1203  Resp 19 02/13/22 1203  SpO2 99 % 02/13/22 1203  Vitals shown include unvalidated device data.  Last Pain:  Vitals:   02/13/22 1157  TempSrc:   PainSc: 0-No pain      Patients Stated Pain Goal: 3 (50/03/70 4888)  Complications: No notable events documented.

## 2022-02-13 NOTE — Anesthesia Procedure Notes (Signed)
Date/Time: 02/13/2022 10:56 AM  Performed by: Donalda Ewings, CindyPre-anesthesia Checklist: Patient identified, Emergency Drugs available, Suction available, Patient being monitored and Timeout performed Patient Re-evaluated:Patient Re-evaluated prior to induction Oxygen Delivery Method: Supernova nasal CPAP Preoxygenation: Pre-oxygenation with 100% oxygen Induction Type: IV induction Airway Equipment and Method: Bite block Placement Confirmation: positive ETCO2 and CO2 detector

## 2022-02-13 NOTE — Op Note (Signed)
[facility] Gastroenterology Patient Name: Genesys Coggeshall Procedure Date: 02/13/2022 10:32 AM MRN: [id number] Account #: [id number] Date of Birth: 1960-09-27 Admit Type: Outpatient Age: 61 Room: [facility] ENDO ROOM 2 Gender: Female Note Status: Finalized Instrument Name: Upper Endoscope [id number] Procedure:             Upper GI endoscopy Indications:           Epigastric abdominal pain, Abdominal bloating Providers:             Annamaria Helling DO, DO Medicines:             Monitored Anesthesia Care Complications:         No immediate complications. Estimated blood loss:                         Minimal. Procedure:             Pre-Anesthesia Assessment:                        - Prior to the procedure, a History and Physical was                         performed, and patient medications and allergies were                         reviewed. The patient is competent. The risks and                         benefits of the procedure and the sedation options and                         risks were discussed with the patient. All questions                         were answered and informed consent was obtained.                         Patient identification and proposed procedure were                         verified by the physician, the nurse, the anesthetist                         and the technician in the endoscopy suite. Mental                         Status Examination: alert and oriented. Airway                         Examination: normal oropharyngeal airway and neck                         mobility. Respiratory Examination: clear to                         auscultation. CV Examination: RRR, no murmurs, no S3                         or S4. Prophylactic Antibiotics: The  patient does not                         require prophylactic antibiotics. Prior                         Anticoagulants: The patient has taken no previous                         anticoagulant or  antiplatelet agents. ASA Grade                         Assessment: III - A patient with severe systemic                         disease. After reviewing the risks and benefits, the                         patient was deemed in satisfactory condition to                         undergo the procedure. The anesthesia plan was to use                         monitored anesthesia care (MAC). Immediately prior to                         administration of medications, the patient was                         re-assessed for adequacy to receive sedatives. The                         heart rate, respiratory rate, oxygen saturations,                         blood pressure, adequacy of pulmonary ventilation, and                         response to care were monitored throughout the                         procedure. The physical status of the patient was                         re-assessed after the procedure.                        After obtaining informed consent, the endoscope was                         passed under direct vision. Throughout the procedure,                         the patient's blood pressure, pulse, and oxygen                         saturations were monitored continuously. The Endoscope  was introduced through the mouth, and advanced to the                         second part of duodenum. The upper GI endoscopy was                         accomplished without difficulty. The patient tolerated                         the procedure well. Findings:      The duodenal bulb, first portion of the duodenum and second portion of       the duodenum were normal. Biopsies for histology were taken with a cold       forceps for evaluation of celiac disease. Estimated blood loss was       minimal.      Localized mild inflammation characterized by erythema was found in the       gastric antrum. Biopsies were taken with a cold forceps for Helicobacter       pylori testing.  Estimated blood loss was minimal.      The exam of the stomach was otherwise normal.      The Z-line was regular. Estimated blood loss: none.      Esophagogastric landmarks were identified: the gastroesophageal junction       was found at 40 cm from the incisors.      The exam of the esophagus was otherwise normal. Impression:            - Normal duodenal bulb, first portion of the duodenum                         and second portion of the duodenum. Biopsied.                        - Gastritis. Biopsied.                        - Z-line regular.                        - Esophagogastric landmarks identified. Recommendation:        - Patient has a contact number available for                         emergencies. The signs and symptoms of potential                         delayed complications were discussed with the patient.                         Return to normal activities tomorrow. Written                         discharge instructions were provided to the patient.                        - Discharge patient to home.                        - Resume previous diet.                        -  Continue present medications.                        - Await pathology results.                        - Return to GI clinic as previously scheduled.                        - proceed with colonoscopy                        - The findings and recommendations were discussed with                         the patient. Procedure Code(s):     --- Professional ---                        [phone], Esophagogastroduodenoscopy, flexible,                         transoral; with biopsy, single or multiple Diagnosis Code(s):     --- Professional ---                        K29.70, Gastritis, unspecified, without bleeding                        R10.13, Epigastric pain                        R14.0, Abdominal distension (gaseous) CPT copyright 2019 American Medical Association. All rights reserved. The codes documented in  this report are preliminary and upon coder review may  be revised to meet current compliance requirements. Attending Participation:      I personally performed the entire procedure. Volney American, DO Annamaria Helling DO, DO 02/13/2022 11:01:48 AM This report has been signed electronically. Number of Addenda: 0 Note Initiated On: 02/13/2022 10:32 AM Estimated Blood Loss:  Estimated blood loss was minimal.      [facility]

## 2022-02-14 ENCOUNTER — Encounter: Payer: Self-pay | Admitting: Gastroenterology

## 2022-02-15 LAB — SURGICAL PATHOLOGY

## 2022-06-01 IMAGING — MR MR BREAST BILAT WO/W CM
3 of 13 series · 10 of 48 positions shown · IV contrast (10ml Gadavist)
Comparison: No prior MRI.  Mammography 12/27/2020 and earlier.

CLINICAL DATA: 60-year-old who carries the [id number] gene mutation.
High risk supplemental screening MRI.

EXAM:
BILATERAL BREAST MRI WITH AND WITHOUT CONTRAST
TECHNIQUE: Multiplanar, multisequence MR images of both breasts were obtained
prior to and following the intravenous administration of 10 ml of
Gadavist.

[Series 2: T1 · axial · B · 1.5mm · 1.02mm/px · z∈[-59,+107]mm · 6 of 112 slices shown]
[im 1/112]
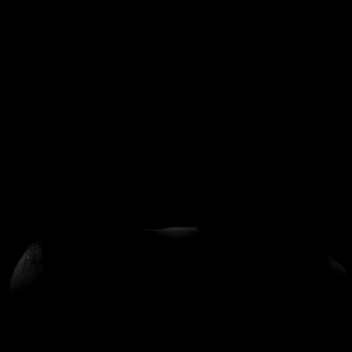
[im 23/112]
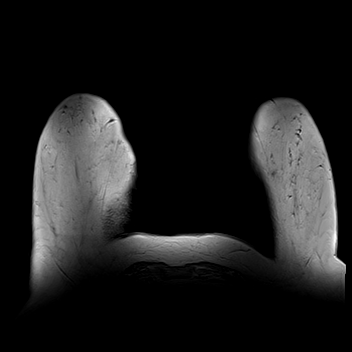
[im 45/112]
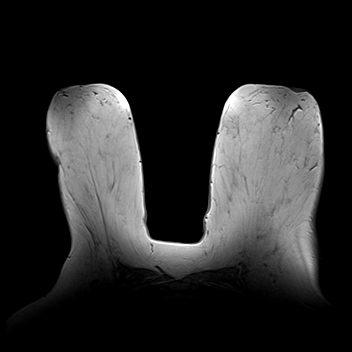
[im 67/112]
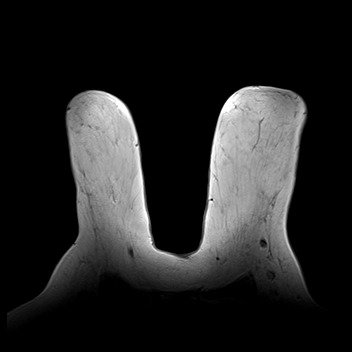
[im 89/112]
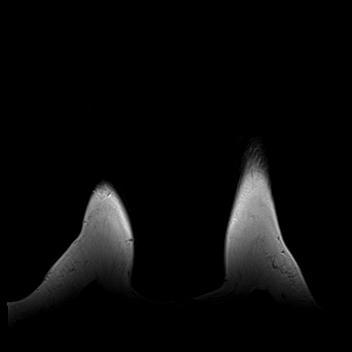
[im 112/112]
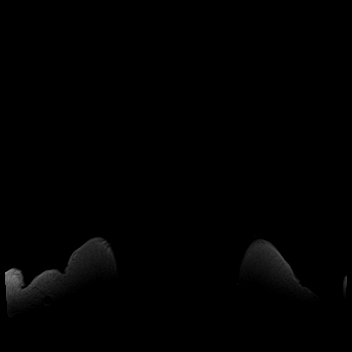

[Series 3: T2 · axial · B · 3.0mm · 1.02mm/px · z∈[-61,+109]mm · 2 of 48 slices shown (1 of 2)]
[im 1/48]
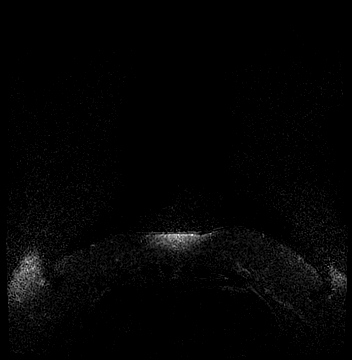
[im 48/48]
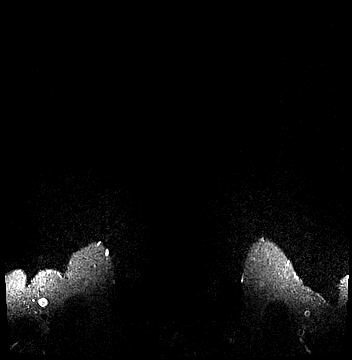

[Series 5: T2 · axial · B · 3.0mm · 1.02mm/px · z∈[-60,+109]mm · 2 of 48 slices shown (2 of 2)]
[im 1/48]
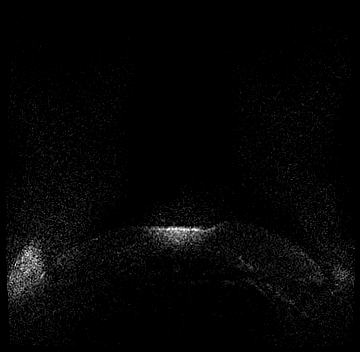
[im 48/48]
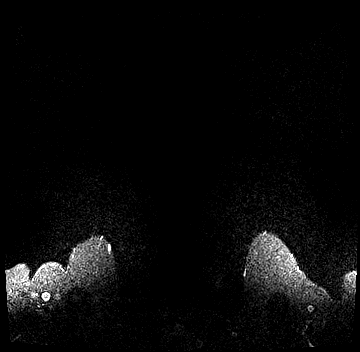

[10 of 48 positions shown; findings below may reference images not displayed]

Three-dimensional MR images were rendered by post-processing of the
original MR data on an independent workstation. The
three-dimensional MR images were interpreted, and findings are
reported in the following complete MRI report for this study. Three
dimensional images were evaluated at the independent interpreting
workstation using the DynaCAD thin client.
FINDINGS: Breast composition: b. Scattered fibroglandular tissue.

Background parenchymal enhancement: Minimal to mild.

Right breast: No suspicious mass or abnormal enhancement.

Left breast: Superficial mass-like enhancement involving the UPPER
INNER QUADRANT at anterior depth with a maximum measurement of
cm, demonstrating plateau kinetics. There is no correlate on the
prior screening mammogram 12/27/2020.

0.5 cm enhancing mass in the UPPER OUTER QUADRANT at anterior depth,
demonstrating washout kinetics. There is no correlate on the prior
screening mammogram 12/27/2020.

Intramammary lymph node in the UPPER OUTER QUADRANT at posterior
depth, confirmed on the prior screening mammogram.

No suspicious mass or abnormal enhancement elsewhere.

Lymph nodes: No pathologic lymphadenopathy.

Ancillary findings:  None.
IMPRESSION: 1. Indeterminate 1.3 cm mass-like enhancement involving the UPPER
INNER QUADRANT of the LEFT breast at anterior depth without
mammographic correlate.
2. Indeterminate 0.5 cm mass involving the UPPER OUTER QUADRANT of
the LEFT breast at anterior depth without mammographic correlate.
3. No MRI evidence of malignancy involving the RIGHT breast.
4. No pathologic lymphadenopathy.

RECOMMENDATION:
1. Second-look ultrasound of the LEFT breast. If the masses are
visible at ultrasound, ultrasound-guided core needle biopsy could be
performed at that time. If the masses are not visible on ultrasound,
then MRI guided core needle biopsy of both lesions is recommended.
2. Further recommendations will be based on the results of the
biopsies. The patient is due for annual mammography in late [REDACTED] or
December 2021.

BI-RADS CATEGORY  4: Suspicious.

## 2024-03-12 ENCOUNTER — Ambulatory Visit

## 2024-04-14 ENCOUNTER — Ambulatory Visit

## 2024-06-18 LAB — GLUCOSE, POCT (MANUAL RESULT ENTRY): POC Glucose: 90 mg/dL (ref 70–99)

## 2024-06-18 NOTE — Congregational Nurse Program (Signed)
°  Dept: [phone]   Congregational Nurse Program Note  Date of Encounter: 06/18/2024 Client into nurse only clinic at [facility] requesting glucose screening. HIPAA and confidentiality reviewed/agreed. Known diabetic on several meds.  Taking all meds as prescribed. Routine follow up with MD. Has run out of glucometer strips but mail order strips were delivered wrong so she doesn't have a current Rx again until early January. Non fasting glucose 90 today. Reinforced positive behaviors/med management. Follow up as needed in this clinic. Rhermann, RN Past Medical History: 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HCC)    Cholecystitis    Depression    Diabetes mellitus without complication (HCC)    Gastrointestinal ulcer    History of blood transfusion    Hyperlipidemia    Hypertension    Infected prosthetic mesh of abdominal wall    Neuro-degenerative disorders    Open abdominal wall wound    Personal history of diabetic foot ulcer    Sleep apnea     Encounter Details:  Community Questionnaire - 06/18/24 1436       Questionnaire   Ask client: Do you give verbal consent for me to treat you today? Yes    Student Assistance N/A    Location Patient Information Systems Manager, [employer]    Encounter Setting CN site    [employer] Status Unknown    Insurance Medicare    Insurance/Financial Assistance Referral N/A    Medication N/A    Medical Provider Yes   [facility]   Screening Referrals Made N/A    Medical Referrals Made N/A    Medical Appointment Completed N/A    CNP Interventions Advocate/Support;Educate    Screenings CN Performed Blood Glucose    ED Visit Averted N/A    Life-Saving Intervention Made N/A
